# Patient Record
Sex: Male | Born: 1950 | Race: Black or African American | Hispanic: No | Marital: Single | Smoking: Current every day smoker
Health system: Southern US, Community
[De-identification: ages and names within clinical notes are randomized; demographics above are authoritative.]

## PROBLEM LIST (undated history)

## (undated) DIAGNOSIS — I2510 Atherosclerotic heart disease of native coronary artery without angina pectoris: Secondary | ICD-10-CM

## (undated) DIAGNOSIS — I639 Cerebral infarction, unspecified: Secondary | ICD-10-CM

## (undated) DIAGNOSIS — E119 Type 2 diabetes mellitus without complications: Secondary | ICD-10-CM

---

## 2012-05-10 ENCOUNTER — Emergency Department (HOSPITAL_COMMUNITY): Payer: Medicare Other

## 2012-05-10 ENCOUNTER — Encounter (HOSPITAL_COMMUNITY): Payer: Self-pay | Admitting: Emergency Medicine

## 2012-05-10 ENCOUNTER — Emergency Department (HOSPITAL_COMMUNITY)
Admission: EM | Admit: 2012-05-10 | Discharge: 2012-05-10 | Disposition: A | Discharge status: Home / Self Care | Payer: Medicare Other | Attending: Emergency Medicine | Admitting: Emergency Medicine

## 2012-05-10 DIAGNOSIS — M25551 Pain in right hip: Secondary | ICD-10-CM

## 2012-05-10 DIAGNOSIS — I2510 Atherosclerotic heart disease of native coronary artery without angina pectoris: Secondary | ICD-10-CM | POA: Insufficient documentation

## 2012-05-10 DIAGNOSIS — Z79899 Other long term (current) drug therapy: Secondary | ICD-10-CM | POA: Insufficient documentation

## 2012-05-10 DIAGNOSIS — E119 Type 2 diabetes mellitus without complications: Secondary | ICD-10-CM | POA: Insufficient documentation

## 2012-05-10 DIAGNOSIS — F17200 Nicotine dependence, unspecified, uncomplicated: Secondary | ICD-10-CM | POA: Insufficient documentation

## 2012-05-10 DIAGNOSIS — Z7902 Long term (current) use of antithrombotics/antiplatelets: Secondary | ICD-10-CM | POA: Insufficient documentation

## 2012-05-10 DIAGNOSIS — Z7982 Long term (current) use of aspirin: Secondary | ICD-10-CM | POA: Insufficient documentation

## 2012-05-10 DIAGNOSIS — Z8673 Personal history of transient ischemic attack (TIA), and cerebral infarction without residual deficits: Secondary | ICD-10-CM | POA: Insufficient documentation

## 2012-05-10 DIAGNOSIS — M25559 Pain in unspecified hip: Secondary | ICD-10-CM | POA: Insufficient documentation

## 2012-05-10 HISTORY — DX: Cerebral infarction, unspecified: I63.9

## 2012-05-10 HISTORY — DX: Atherosclerotic heart disease of native coronary artery without angina pectoris: I25.10

## 2012-05-10 HISTORY — DX: Type 2 diabetes mellitus without complications: E11.9

## 2012-05-10 LAB — POCT I-STAT, CHEM 8
Calcium, Ion: 1.18 mmol/L (ref 1.13–1.30)
Creatinine, Ser: 1.2 mg/dL (ref 0.50–1.35)
Glucose, Bld: 110 mg/dL — ABNORMAL HIGH (ref 70–99)
HCT: 39 % (ref 39.0–52.0)
Hemoglobin: 13.3 g/dL (ref 13.0–17.0)
Potassium: 3.4 mEq/L — ABNORMAL LOW (ref 3.5–5.1)
TCO2: 25 mmol/L (ref 0–100)

## 2012-05-10 LAB — URINALYSIS, ROUTINE W REFLEX MICROSCOPIC
Bilirubin Urine: NEGATIVE
Glucose, UA: NEGATIVE mg/dL
Hgb urine dipstick: NEGATIVE
Ketones, ur: NEGATIVE mg/dL
Leukocytes, UA: NEGATIVE
Nitrite: NEGATIVE
Protein, ur: NEGATIVE mg/dL
Specific Gravity, Urine: 1.012 (ref 1.005–1.030)
Urobilinogen, UA: 1 mg/dL (ref 0.0–1.0)
pH: 6.5 (ref 5.0–8.0)

## 2012-05-10 MED ORDER — HYDROCODONE-ACETAMINOPHEN 5-325 MG PO TABS: 1.0000 | ORAL_TABLET | Freq: Four times a day (QID) | ORAL | Status: DC | PRN

## 2012-05-10 MED ORDER — HYDROCODONE-ACETAMINOPHEN 5-325 MG PO TABS
1.0000 | ORAL_TABLET | Freq: Once | ORAL | Status: AC
  Administered 2012-05-10: 1 via ORAL
  Filled 2012-05-10: qty 1

## 2012-05-10 NOTE — ED Provider Notes (Signed)
History     CSN: [id number]  Arrival date & time 05/10/12  4098   First MD Initiated Contact with Patient 05/10/12 [phone]      Chief Complaint  Patient presents with  . Hip Pain    (Consider location/radiation/quality/duration/timing/severity/associated sxs/prior treatment) HPI Patient presents to the emergency department with bilateral hip pain, worse on the right.  Patient, states, that he was walking to the store when he started having bilateral hip pain.  Patient denies abdominal pain, nausea, vomiting, shortness of breath, chest pain, back pain, dysuria, fever, dizziness, or syncope.  Patient, states, that he not have any history of back or hip pain.  He says, states, that did not fall or have any injury to the hips. Past Medical History  Diagnosis Date  . Diabetes mellitus without complication   . Coronary artery disease   . Stroke     History reviewed. No pertinent past surgical history.  History reviewed. No pertinent family history.  History  Substance Use Topics  . Smoking status: Current Every Day Smoker -- 0.50 packs/day for 25 years    Types: Cigarettes  . Smokeless tobacco: Not on file  . Alcohol Use: No      Review of Systems All other systems negative except as documented in the HPI. All pertinent positives and negatives as reviewed in the HPI.  Allergies  Review of patient's allergies indicates no known allergies.  Home Medications   Current Outpatient Rx  Name  Route  Sig  Dispense  Refill  . amLODipine (NORVASC) 10 MG tablet   Oral   Take 10 mg by mouth daily.         Marland Kitchen aspirin EC 325 MG tablet   Oral   Take 325 mg by mouth daily.         Marland Kitchen atorvastatin (LIPITOR) 40 MG tablet   Oral   Take 40 mg by mouth daily.         . clopidogrel (PLAVIX) 75 MG tablet   Oral   Take 75 mg by mouth daily.         Marland Kitchen FLUoxetine (PROZAC) 10 MG capsule   Oral   Take 10 mg by mouth daily.         . isosorbide mononitrate (IMDUR) 60 MG 24 hr  tablet   Oral   Take 60 mg by mouth daily.         Marland Kitchen lisinopril (PRINIVIL,ZESTRIL) 40 MG tablet   Oral   Take 40 mg by mouth daily.         . naproxen (NAPROSYN) 250 MG tablet   Oral   Take 250 mg by mouth daily.         . nitroGLYCERIN (NITROSTAT) 0.4 MG SL tablet   Sublingual   Place 0.4 mg under the tongue every 5 (five) minutes as needed for chest pain.         Marland Kitchen HYDROcodone-acetaminophen (NORCO/VICODIN) 5-325 MG per tablet   Oral   Take 1 tablet by mouth every 6 (six) hours as needed for pain.   15 tablet   0     BP 148/64  Pulse 66  Temp(Src) 98.2 F (36.8 C) (Oral)  Resp 16  SpO2 100%  Physical Exam  Constitutional: He is oriented to person, place, and time. He appears well-developed and well-nourished. No distress.  HENT:  Head: Normocephalic and atraumatic.  Mouth/Throat: Oropharynx is clear and moist.  Eyes: Pupils are equal, round, and reactive to light.  Neck: Normal range of motion. Neck supple.  Cardiovascular: Normal rate, regular rhythm and normal heart sounds.  Exam reveals no gallop and no friction rub.   No murmur heard. Pulmonary/Chest: Effort normal and breath sounds normal. No respiratory distress. He has no rales.  Abdominal: Soft. Bowel sounds are normal. He exhibits no distension. There is no tenderness.  Musculoskeletal:       Right hip: He exhibits tenderness. He exhibits normal range of motion, normal strength, no bony tenderness, no swelling, no crepitus and no deformity.       Left hip: He exhibits tenderness. He exhibits normal range of motion, normal strength, no bony tenderness, no swelling, no crepitus and no deformity.  Neurological: He is alert and oriented to person, place, and time. He exhibits normal muscle tone. Coordination normal.  Skin: Skin is warm and dry. No rash noted.    ED Course  Procedures (including critical care time)  Labs Reviewed  POCT I-STAT, CHEM 8 - Abnormal; Notable for the following:     Potassium 3.4 (*)    Glucose, Bld 110 (*)    All other components within normal limits  URINALYSIS, ROUTINE W REFLEX MICROSCOPIC   Dg Lumbar Spine Complete  05/10/2012  *RADIOLOGY REPORT*  Clinical Data:  lower back pain, fall  LUMBAR SPINE - COMPLETE 4+ VIEW  Comparison: Fall  Findings: Five views of the lumbar spine submitted.  No acute fracture or subluxation.  Multilevel mild anterior spurring.  The alignment and vertebral height are preserved.  Atherosclerotic calcifications of the abdominal aorta.  IMPRESSION: . No acute fracture or subluxation.  Multilevel mild degenerative changes.   Original Report Authenticated By: Natasha Mead, M.D.    Dg Pelvis 1-2 Views  05/10/2012  *RADIOLOGY REPORT*  Clinical Data: Fall, back pain  PELVIS - 1-2 VIEW  Comparison: None.  Findings: Two views of the pelvis submitted.  No acute fracture or subluxation.  Degenerative changes noted bilateral hip joints with narrowing superior joint space. Bilateral superior acetabular spurring.  IMPRESSION: No acute fracture or subluxation.  Degenerative changes bilateral hip joints.   Original Report Authenticated By: Natasha Mead, M.D.      1. Hip pain, right    Patient treated for his hip pain, did some basic labs tests on the patient with no abnormalities.  Advised patient, that he'll need to follow up with his primary care Dr. for further evaluation and recheck.  He is advised to return here for any worsening in his condition.  Patient has normal distal pulses and sensation in his leg.  Patient does not have any motor deficits and has normal reflexes in all 4 extremities.   MDM  MDM Reviewed: vitals and nursing note Interpretation: labs and x-ray            Carlyle Dolly, PA-C 05/11/12 1521

## 2012-05-10 NOTE — ED Notes (Signed)
Attempted to collect urine, pt could not void 

## 2012-05-10 NOTE — Discharge Instructions (Signed)
Your tests here today were normal. return here as needed.followup with your primary care Dr. For recheck

## 2012-05-10 NOTE — ED Notes (Signed)
Pt arrives to ed via [facility] c/o bilat hip pain onset 1 hr pta.  Pt denies fall/previous surgeries on hips.  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  Caox4, pmsx4, nad.

## 2012-05-11 NOTE — ED Provider Notes (Signed)
Medical screening examination/treatment/procedure(s) were performed by non-physician practitioner and as supervising physician I was immediately available for consultation/collaboration.   Loren Racer, MD 05/11/12 1537

## 2014-12-07 ENCOUNTER — Encounter (HOSPITAL_COMMUNITY): Payer: Self-pay | Admitting: Emergency Medicine

## 2014-12-07 ENCOUNTER — Emergency Department (HOSPITAL_COMMUNITY): Payer: Medicare Other

## 2014-12-07 ENCOUNTER — Emergency Department (HOSPITAL_COMMUNITY)
Admission: EM | Admit: 2014-12-07 | Discharge: 2014-12-07 | Disposition: A | Discharge status: Home / Self Care | Payer: Medicare Other | Attending: Emergency Medicine | Admitting: Emergency Medicine

## 2014-12-07 DIAGNOSIS — I2510 Atherosclerotic heart disease of native coronary artery without angina pectoris: Secondary | ICD-10-CM | POA: Insufficient documentation

## 2014-12-07 DIAGNOSIS — W231XXA Caught, crushed, jammed, or pinched between stationary objects, initial encounter: Secondary | ICD-10-CM | POA: Diagnosis not present

## 2014-12-07 DIAGNOSIS — S99921A Unspecified injury of right foot, initial encounter: Secondary | ICD-10-CM | POA: Diagnosis present

## 2014-12-07 DIAGNOSIS — Z79899 Other long term (current) drug therapy: Secondary | ICD-10-CM | POA: Diagnosis not present

## 2014-12-07 DIAGNOSIS — E119 Type 2 diabetes mellitus without complications: Secondary | ICD-10-CM | POA: Insufficient documentation

## 2014-12-07 DIAGNOSIS — Z7982 Long term (current) use of aspirin: Secondary | ICD-10-CM | POA: Diagnosis not present

## 2014-12-07 DIAGNOSIS — M79674 Pain in right toe(s): Secondary | ICD-10-CM

## 2014-12-07 DIAGNOSIS — F17210 Nicotine dependence, cigarettes, uncomplicated: Secondary | ICD-10-CM | POA: Insufficient documentation

## 2014-12-07 DIAGNOSIS — Y9389 Activity, other specified: Secondary | ICD-10-CM | POA: Diagnosis not present

## 2014-12-07 DIAGNOSIS — Z7902 Long term (current) use of antithrombotics/antiplatelets: Secondary | ICD-10-CM | POA: Insufficient documentation

## 2014-12-07 DIAGNOSIS — Z8673 Personal history of transient ischemic attack (TIA), and cerebral infarction without residual deficits: Secondary | ICD-10-CM | POA: Diagnosis not present

## 2014-12-07 DIAGNOSIS — Y9289 Other specified places as the place of occurrence of the external cause: Secondary | ICD-10-CM | POA: Insufficient documentation

## 2014-12-07 DIAGNOSIS — S90121A Contusion of right lesser toe(s) without damage to nail, initial encounter: Secondary | ICD-10-CM | POA: Insufficient documentation

## 2014-12-07 DIAGNOSIS — Y998 Other external cause status: Secondary | ICD-10-CM | POA: Diagnosis not present

## 2014-12-07 LAB — CBG MONITORING, ED: Glucose-Capillary: 134 mg/dL — ABNORMAL HIGH (ref 65–99)

## 2014-12-07 MED ORDER — NAPROXEN 250 MG PO TABS: 250.0000 mg | ORAL_TABLET | Freq: Two times a day (BID) | ORAL | Status: DC

## 2014-12-07 NOTE — ED Notes (Signed)
Patient brought back to room via wheelchair; patient getting undressed and into a gown at this time; Tammy SoursGreg, RN aware

## 2014-12-07 NOTE — ED Notes (Signed)
Pt states that he stumped his toe 1 month ago and pain has gotten increasely worse and also has a blacken area on his third toe. Pt has hx of diabetes. Sensation in intact, pedal pulses +2 equal bilaterally.

## 2014-12-07 NOTE — ED Provider Notes (Signed)
CSN: [id number]     Arrival date & time 12/07/14  1107 History   First MD Initiated Contact with Patient 12/07/14 1517     Chief Complaint  Patient presents with  . Toe Pain    Patient is a 64 y.o. male presenting with extremity pain. The history is provided by the patient.  Extremity Pain This is a new problem. The current episode started 1 to 4 weeks ago. The problem has been gradually worsening. Pertinent negatives include no abdominal pain, chest pain, chills, coughing, fever, headaches, nausea, neck pain, rash, sore throat or vomiting. He has tried acetaminophen for the symptoms.    Past Medical History  Diagnosis Date  . Diabetes mellitus without complication (HCC)   . Coronary artery disease   . Stroke [facility](HCC)    History reviewed. No pertinent past surgical history. No family history on file. Social History  Substance Use Topics  . Smoking status: Current Every Day Smoker -- 0.50 packs/day for 25 years    Types: Cigarettes  . Smokeless tobacco: None  . Alcohol Use: No    Review of Systems  Constitutional: Negative for fever and chills.  HENT: Negative for rhinorrhea and sore throat.   Eyes: Negative for visual disturbance.  Respiratory: Negative for cough and shortness of breath.   Cardiovascular: Negative for chest pain.  Gastrointestinal: Negative for nausea, vomiting, abdominal pain, diarrhea and constipation.  Genitourinary: Negative for dysuria and hematuria.  Musculoskeletal: Negative for back pain and neck pain.       Toe pain  Skin: Negative for rash.  Neurological: Negative for syncope and headaches.  Psychiatric/Behavioral: Negative for confusion.  All other systems reviewed and are negative.  Allergies  Review of patient's allergies indicates no known allergies.  Home Medications   Prior to Admission medications   Medication Sig Start Date End Date Taking? Authorizing Provider  amLODipine (NORVASC) 10 MG tablet Take 10 mg by mouth daily.     Historical Provider, MD  aspirin EC 325 MG tablet Take 325 mg by mouth daily.    Historical Provider, MD  atorvastatin (LIPITOR) 40 MG tablet Take 40 mg by mouth daily.    Historical Provider, MD  clopidogrel (PLAVIX) 75 MG tablet Take 75 mg by mouth daily.    Historical Provider, MD  FLUoxetine (PROZAC) 10 MG capsule Take 10 mg by mouth daily.    Historical Provider, MD  HYDROcodone-acetaminophen (NORCO/VICODIN) 5-325 MG per tablet Take 1 tablet by mouth every 6 (six) hours as needed for pain. 05/10/12   Charlestine Nighthristopher Lawyer, PA-C  isosorbide mononitrate (IMDUR) 60 MG 24 hr tablet Take 60 mg by mouth daily.    Historical Provider, MD  lisinopril (PRINIVIL,ZESTRIL) 40 MG tablet Take 40 mg by mouth daily.    Historical Provider, MD  naproxen (NAPROSYN) 250 MG tablet Take 1 tablet (250 mg total) by mouth 2 (two) times daily with a meal. 12/07/14   Maris BergerJonah Keante Urizar, MD  nitroGLYCERIN (NITROSTAT) 0.4 MG SL tablet Place 0.4 mg under the tongue every 5 (five) minutes as needed for chest pain.    Historical Provider, MD   BP 123/59 mmHg  Pulse 79  Temp(Src) 98.7 F (37.1 C) (Oral)  Resp 16  Ht 5\' 6"  (1.676 m)  Wt 81.647 kg  BMI 29.07 kg/m2  SpO2 100% Physical Exam  Constitutional: He is oriented to person, place, and time. He appears well-developed and well-nourished. No distress.  HENT:  Head: Normocephalic and atraumatic.  Mouth/Throat: Oropharynx is clear and moist.  Eyes: EOM are normal.  Neck: Neck supple. No JVD present.  Cardiovascular: Normal rate, regular rhythm, normal heart sounds and intact distal pulses.   Pulmonary/Chest: Effort normal and breath sounds normal.  Abdominal: Soft. He exhibits no distension. There is no tenderness.  Musculoskeletal: Normal range of motion. He exhibits no edema.  Ecchymosis right 3rd toe, tender to palpation or manipulation. Symmetric distal pulses.   Neurological: He is alert and oriented to person, place, and time. No cranial nerve deficit.  Skin: Skin  is warm and dry.  Psychiatric: His behavior is normal.    ED Course  Procedures  None   Labs Review Labs Reviewed  CBG MONITORING, ED - Abnormal; Notable for the following:    Glucose-Capillary 134 (*)    All other components within normal limits    Imaging Review Dg Foot Complete Right  12/07/2014  CLINICAL DATA:  Third toe pain for 1 month after stubbing injury. Discolored foot. Initial encounter. EXAM: RIGHT FOOT COMPLETE - 3+ VIEW COMPARISON:  None. FINDINGS: When allowing for digital obliquity, there is no convincing toe fracture and no dislocation. No opaque foreign body or other acute soft tissue finding. 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 Chondroid tumor is the main differential consideration. The appearance is overall benign. IMPRESSION: 1. No acute osseous finding. 2. 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 Electronically Signed   By: Marnee Spring M.D.   On: 12/07/2014 13:29   I have personally reviewed and evaluated these images and lab results as part of my medical decision-making.  MDM   Final diagnoses:  Pain of toe of right foot   64 yo diabetic male presents with right toe pain after stumping his foot over 1 month ago. Denies fever. Does have neuropathy. XR neg for acute fracture or periosteal changes. 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 Findings discussed with patient. Recommended judicious NSAID use for pain and podiatry follow up. I provided verbal discharge instructions including follow up and return precautions. Stable for discharge.   Case discussed with Dr. Denton Lank.   Maris Berger, MD 12/07/14 7829  Cathren Laine, MD 12/08/14 [phone]

## 2014-12-15 ENCOUNTER — Ambulatory Visit: Payer: Self-pay | Admitting: Family Medicine

## 2014-12-15 ENCOUNTER — Ambulatory Visit: Payer: Self-pay | Admitting: Podiatry

## 2014-12-22 ENCOUNTER — Ambulatory Visit: Payer: Self-pay | Admitting: Family Medicine

## 2014-12-23 ENCOUNTER — Encounter (HOSPITAL_COMMUNITY): Payer: Self-pay | Admitting: Emergency Medicine

## 2014-12-23 ENCOUNTER — Emergency Department (HOSPITAL_COMMUNITY)
Admission: EM | Admit: 2014-12-23 | Discharge: 2014-12-23 | Disposition: A | Discharge status: Home / Self Care | Payer: Medicare Other | Attending: Emergency Medicine | Admitting: Emergency Medicine

## 2014-12-23 DIAGNOSIS — I2510 Atherosclerotic heart disease of native coronary artery without angina pectoris: Secondary | ICD-10-CM | POA: Insufficient documentation

## 2014-12-23 DIAGNOSIS — M545 Low back pain: Secondary | ICD-10-CM | POA: Diagnosis present

## 2014-12-23 DIAGNOSIS — Z8673 Personal history of transient ischemic attack (TIA), and cerebral infarction without residual deficits: Secondary | ICD-10-CM | POA: Diagnosis not present

## 2014-12-23 DIAGNOSIS — E119 Type 2 diabetes mellitus without complications: Secondary | ICD-10-CM | POA: Diagnosis not present

## 2014-12-23 DIAGNOSIS — M5441 Lumbago with sciatica, right side: Secondary | ICD-10-CM | POA: Insufficient documentation

## 2014-12-23 DIAGNOSIS — Z7982 Long term (current) use of aspirin: Secondary | ICD-10-CM | POA: Diagnosis not present

## 2014-12-23 DIAGNOSIS — Z7902 Long term (current) use of antithrombotics/antiplatelets: Secondary | ICD-10-CM | POA: Insufficient documentation

## 2014-12-23 DIAGNOSIS — F17210 Nicotine dependence, cigarettes, uncomplicated: Secondary | ICD-10-CM | POA: Insufficient documentation

## 2014-12-23 DIAGNOSIS — G8929 Other chronic pain: Secondary | ICD-10-CM | POA: Insufficient documentation

## 2014-12-23 DIAGNOSIS — Z791 Long term (current) use of non-steroidal anti-inflammatories (NSAID): Secondary | ICD-10-CM | POA: Insufficient documentation

## 2014-12-23 DIAGNOSIS — Z79899 Other long term (current) drug therapy: Secondary | ICD-10-CM | POA: Insufficient documentation

## 2014-12-23 LAB — CBG MONITORING, ED: GLUCOSE-CAPILLARY: 134 mg/dL — AB (ref 65–99)

## 2014-12-23 MED ORDER — ONDANSETRON 4 MG PO TBDP
4.0000 mg | ORAL_TABLET | Freq: Once | ORAL | Status: AC
  Administered 2014-12-23: 4 mg via ORAL
  Filled 2014-12-23: qty 1

## 2014-12-23 MED ORDER — PROCHLORPERAZINE EDISYLATE 5 MG/ML IJ SOLN
5.0000 mg | Freq: Once | INTRAMUSCULAR | Status: AC
  Administered 2014-12-23: 5 mg via INTRAMUSCULAR
  Filled 2014-12-23: qty 2

## 2014-12-23 MED ORDER — DIPHENHYDRAMINE HCL 50 MG/ML IJ SOLN
25.0000 mg | Freq: Once | INTRAMUSCULAR | Status: AC
  Administered 2014-12-23: 25 mg via INTRAMUSCULAR
  Filled 2014-12-23: qty 1

## 2014-12-23 MED ORDER — OXYCODONE-ACETAMINOPHEN 5-325 MG PO TABS: 1.0000 | ORAL_TABLET | ORAL | Status: AC | PRN

## 2014-12-23 MED ORDER — HYDROMORPHONE HCL 1 MG/ML IJ SOLN
2.0000 mg | Freq: Once | INTRAMUSCULAR | Status: AC
  Administered 2014-12-23: 2 mg via INTRAMUSCULAR
  Filled 2014-12-23: qty 2

## 2014-12-23 NOTE — ED Notes (Signed)
Pt reports lower back pain since yesterday, has chronic back pain. Called EMS to bring him. Pt reports takes percocet for pain and is out of meds.

## 2014-12-23 NOTE — Discharge Instructions (Signed)
SEEK IMMEDIATE MEDICAL ATTENTION IF: New numbness, tingling, weakness, or problem with the use of your arms or legs.  Severe back pain not relieved with medications.  Change in bowel or bladder control.  Increasing pain in any areas of the body (such as chest or abdominal pain).  Shortness of breath, dizziness or fainting.  Nausea (feeling sick to your stomach), vomiting, fever, or sweats.  Chronic Back Pain  When back pain lasts longer than 3 months, it is called chronic back pain.People with chronic back pain often go through certain periods that are more intense (flare-ups).  CAUSES Chronic back pain can be caused by wear and tear (degeneration) on different structures in your back. These structures include:  The bones of your spine (vertebrae) and the joints surrounding your spinal cord and nerve roots (facets).  The strong, fibrous tissues that connect your vertebrae (ligaments). Degeneration of these structures may result in pressure on your nerves. This can lead to constant pain. HOME CARE INSTRUCTIONS  Avoid bending, heavy lifting, prolonged sitting, and activities which make the problem worse.  Take brief periods of rest throughout the day to reduce your pain. Lying down or standing usually is better than sitting while you are resting.  Take over-the-counter or prescription medicines only as directed by your caregiver. SEEK IMMEDIATE MEDICAL CARE IF:   You have weakness or numbness in one of your legs or feet.  You have trouble controlling your bladder or bowels.  You have nausea, vomiting, abdominal pain, shortness of breath, or fainting.   This information is not intended to replace advice given to you by your health care provider. Make sure you discuss any questions you have with your health care provider.   Document Released: 02/02/2004 Document Revised: 03/19/2011 Document Reviewed: 06/14/2014 Elsevier Interactive Patient Education 2016 [employer]  Back  Exercises The following exercises strengthen the muscles that help to support the back. They also help to keep the lower back flexible. Doing these exercises can help to prevent back pain or lessen existing pain. If you have back pain or discomfort, try doing these exercises 2-3 times each day or as told by your health care provider. When the pain goes away, do them once each day, but increase the number of times that you repeat the steps for each exercise (do more repetitions). If you do not have back pain or discomfort, do these exercises once each day or as told by your health care provider. EXERCISES Single Knee to Chest Repeat these steps 3-5 times for each leg:  Lie on your back on a firm bed or the floor with your legs extended.  Bring one knee to your chest. Your other leg should stay extended and in contact with the floor.  Hold your knee in place by grabbing your knee or thigh.  Pull on your knee until you feel a gentle stretch in your lower back.  Hold the stretch for 10-30 seconds.  Slowly release and straighten your leg. Pelvic Tilt Repeat these steps 5-10 times:  Lie on your back on a firm bed or the floor with your legs extended.  Bend your knees so they are pointing toward the ceiling and your feet are flat on the floor.  Tighten your lower abdominal muscles to press your lower back against the floor. This motion will tilt your pelvis so your tailbone points up toward the ceiling instead of pointing to your feet or the floor.  With gentle tension and even breathing, hold this position  for 5-10 seconds. Cat-Cow Repeat these steps until your lower back becomes more flexible:  Get into a hands-and-knees position on a firm surface. Keep your hands under your shoulders, and keep your knees under your hips. You may place padding under your knees for comfort.  Let your head hang down, and point your tailbone toward the floor so your lower back becomes rounded like the back  of a cat.  Hold this position for 5 seconds.  Slowly lift your head and point your tailbone up toward the ceiling so your back forms a sagging arch like the back of a cow.  Hold this position for 5 seconds. Press-Ups Repeat these steps 5-10 times:  Lie on your abdomen (face-down) on the floor.  Place your palms near your head, about shoulder-width apart.  While you keep your back as relaxed as possible and keep your hips on the floor, slowly straighten your arms to raise the top half of your body and lift your shoulders. Do not use your back muscles to raise your upper torso. You may adjust the placement of your hands to make yourself more comfortable.  Hold this position for 5 seconds while you keep your back relaxed.  Slowly return to lying flat on the floor. Bridges Repeat these steps 10 times:  Lie on your back on a firm surface.  Bend your knees so they are pointing toward the ceiling and your feet are flat on the floor.  Tighten your buttocks muscles and lift your buttocks off of the floor until your waist is at almost the same height as your knees. You should feel the muscles working in your buttocks and the back of your thighs. If you do not feel these muscles, slide your feet 1-2 inches farther away from your buttocks.  Hold this position for 3-5 seconds.  Slowly lower your hips to the starting position, and allow your buttocks muscles to relax completely. If this exercise is too easy, try doing it with your arms crossed over your chest. Abdominal Crunches Repeat these steps 5-10 times:  Lie on your back on a firm bed or the floor with your legs extended.  Bend your knees so they are pointing toward the ceiling and your feet are flat on the floor.  Cross your arms over your chest.  Tip your chin slightly toward your chest without bending your neck.  Tighten your abdominal muscles and slowly raise your trunk (torso) high enough to lift your shoulder blades a tiny  bit off of the floor. Avoid raising your torso higher than that, because it can put too much stress on your low back and it does not help to strengthen your abdominal muscles.  Slowly return to your starting position. Back Lifts Repeat these steps 5-10 times:  Lie on your abdomen (face-down) with your arms at your sides, and rest your forehead on the floor.  Tighten the muscles in your legs and your buttocks.  Slowly lift your chest off of the floor while you keep your hips pressed to the floor. Keep the back of your head in line with the curve in your back. Your eyes should be looking at the floor.  Hold this position for 3-5 seconds.  Slowly return to your starting position. SEEK MEDICAL CARE IF:  Your back pain or discomfort gets much worse when you do an exercise.  Your back pain or discomfort does not lessen within 2 hours after you exercise. If you have any of these problems, stop  doing these exercises right away. Do not do them again unless your health care provider says that you can. SEEK IMMEDIATE MEDICAL CARE IF:  You develop sudden, severe back pain. If this happens, stop doing the exercises right away. Do not do them again unless your health care provider says that you can.   This information is not intended to replace advice given to you by your health care provider. Make sure you discuss any questions you have with your health care provider.   Document Released: 02/02/2004 Document Revised: 09/15/2014 Document Reviewed: 02/18/2014 Elsevier Interactive Patient Education 2016 [employer]  Sciatica Sciatica is pain, weakness, numbness, or tingling along the path of the sciatic nerve. The nerve starts in the lower back and runs down the back of each leg. The nerve controls the muscles in the lower leg and in the back of the knee, while also providing sensation to the back of the thigh, lower leg, and the sole of your foot. Sciatica is a symptom of another medical  condition. For instance, nerve damage or certain conditions, such as a herniated disk or bone spur on the spine, pinch or put pressure on the sciatic nerve. This causes the pain, weakness, or other sensations normally associated with sciatica. Generally, sciatica only affects one side of the body. CAUSES   Herniated or slipped disc.  Degenerative disk disease.  A pain disorder involving the narrow muscle in the buttocks (piriformis syndrome).  Pelvic injury or fracture.  Pregnancy.  Tumor (rare). SYMPTOMS  Symptoms can vary from mild to very severe. The symptoms usually travel from the low back to the buttocks and down the back of the leg. Symptoms can include:  Mild tingling or dull aches in the lower back, leg, or hip.  Numbness in the back of the calf or sole of the foot.  Burning sensations in the lower back, leg, or hip.  Sharp pains in the lower back, leg, or hip.  Leg weakness.  Severe back pain inhibiting movement. These symptoms may get worse with coughing, sneezing, laughing, or prolonged sitting or standing. Also, being overweight may worsen symptoms. DIAGNOSIS  Your caregiver will perform a physical exam to look for common symptoms of sciatica. He or she may ask you to do certain movements or activities that would trigger sciatic nerve pain. Other tests may be performed to find the cause of the sciatica. These may include:  Blood tests.  X-rays.  Imaging tests, such as an MRI or CT scan. TREATMENT  Treatment is directed at the cause of the sciatic pain. Sometimes, treatment is not necessary and the pain and discomfort goes away on its own. If treatment is needed, your caregiver may suggest:  Over-the-counter medicines to relieve pain.  Prescription medicines, such as anti-inflammatory medicine, muscle relaxants, or narcotics.  Applying heat or ice to the painful area.  Steroid injections to lessen pain, irritation, and inflammation around the  nerve.  Reducing activity during periods of pain.  Exercising and stretching to strengthen your abdomen and improve flexibility of your spine. Your caregiver may suggest losing weight if the extra weight makes the back pain worse.  Physical therapy.  Surgery to eliminate what is pressing or pinching the nerve, such as a bone spur or part of a herniated disk. HOME CARE INSTRUCTIONS   Only take over-the-counter or prescription medicines for pain or discomfort as directed by your caregiver.  Apply ice to the affected area for 20 minutes, 3-4 times a day for the first  48-72 hours. Then try heat in the same way.  Exercise, stretch, or perform your usual activities if these do not aggravate your pain.  Attend physical therapy sessions as directed by your caregiver.  Keep all follow-up appointments as directed by your caregiver.  Do not wear high heels or shoes that do not provide proper support.  Check your mattress to see if it is too soft. A firm mattress may lessen your pain and discomfort. SEEK IMMEDIATE MEDICAL CARE IF:   You lose control of your bowel or bladder (incontinence).  You have increasing weakness in the lower back, pelvis, buttocks, or legs.  You have redness or swelling of your back.  You have a burning sensation when you urinate.  You have pain that gets worse when you lie down or awakens you at night.  Your pain is worse than you have experienced in the past.  Your pain is lasting longer than 4 weeks.  You are suddenly losing weight without reason. MAKE SURE YOU:  Understand these instructions.  Will watch your condition.  Will get help right away if you are not doing well or get worse.   This information is not intended to replace advice given to you by your health care provider. Make sure you discuss any questions you have with your health care provider.   Document Released: 12/19/2000 Document Revised: 09/15/2014 Document Reviewed:  05/06/2011 Elsevier Interactive Patient Education [employer]

## 2014-12-23 NOTE — ED Notes (Signed)
PT reports he feels like he going to vomit. PRN given.

## 2014-12-23 NOTE — ED Notes (Signed)
EMT at bedside attempting to encourage patient with PO challenge.  No more episodes of vomiting noted at this time

## 2014-12-23 NOTE — ED Provider Notes (Signed)
CSN: [id number]     Arrival date & time 12/23/14  1254 History  By signing my name below, I, Tanda Rockers, attest that this documentation has been prepared under the direction and in the presence of Arthor Captain, PA-C.  Electronically Signed: Tanda Rockers, ED Scribe. 12/23/2014. 1:33 PM.   Chief Complaint  Patient presents with  . Back Pain   The history is provided by the patient. No language interpreter was used.     HPI Comments: Ryan Powell is a 64 y.o. male with hx chronic back pain who presents to the Emergency Department complaining of sudden onset, constant, 10/10, right lower back pain radiating down his entire right leg x 2 days. Pt states he was sleeping and rolled over in bed when he began having pain in his back. The pain is exacerbated with movement and prolonged standing. Pt has hx of chronic back pain and states it flares up every once and awhile. He usually takes Percocet for his pain but is currently out of his prescription. Denies dysuria, urgency, frequency, hematuria, urinary or bowel incontinence, weakness, numbness, tingling, or any other associated symptoms.    Past Medical History  Diagnosis Date  . Diabetes mellitus without complication (HCC)   . Coronary artery disease   . Stroke [facility])    History reviewed. No pertinent past surgical history. No family history on file. Social History  Substance Use Topics  . Smoking status: Current Every Day Smoker -- 0.50 packs/day for 25 years    Types: Cigarettes  . Smokeless tobacco: None  . Alcohol Use: No    Review of Systems  Gastrointestinal:       Negative for urinary incontinence  Genitourinary: Negative for dysuria, urgency, frequency and hematuria.       Negative for bowel incontinence  Musculoskeletal: Positive for back pain and arthralgias (Right leg).  Neurological: Negative for weakness and numbness.  All other systems reviewed and are negative.     Allergies  Review of patient's allergies  indicates no known allergies.  Home Medications   Prior to Admission medications   Medication Sig Start Date End Date Taking? Authorizing Provider  amLODipine (NORVASC) 10 MG tablet Take 10 mg by mouth daily.    Historical Provider, MD  aspirin EC 325 MG tablet Take 325 mg by mouth daily.    Historical Provider, MD  atorvastatin (LIPITOR) 40 MG tablet Take 40 mg by mouth daily.    Historical Provider, MD  clopidogrel (PLAVIX) 75 MG tablet Take 75 mg by mouth daily.    Historical Provider, MD  FLUoxetine (PROZAC) 10 MG capsule Take 10 mg by mouth daily.    Historical Provider, MD  HYDROcodone-acetaminophen (NORCO/VICODIN) 5-325 MG per tablet Take 1 tablet by mouth every 6 (six) hours as needed for pain. 05/10/12   Charlestine Night, PA-C  isosorbide mononitrate (IMDUR) 60 MG 24 hr tablet Take 60 mg by mouth daily.    Historical Provider, MD  lisinopril (PRINIVIL,ZESTRIL) 40 MG tablet Take 40 mg by mouth daily.    Historical Provider, MD  naproxen (NAPROSYN) 250 MG tablet Take 1 tablet (250 mg total) by mouth 2 (two) times daily with a meal. 12/07/14   Maris Berger, MD  nitroGLYCERIN (NITROSTAT) 0.4 MG SL tablet Place 0.4 mg under the tongue every 5 (five) minutes as needed for chest pain.    Historical Provider, MD   Triage Vitals: BP 130/74 mmHg  Pulse 70  Temp(Src) 98.3 F (36.8 C) (Oral)  Resp 18  SpO2  100%   Physical Exam  Physical Exam  Constitutional: Pt appears well-developed and well-nourished. No distress.  HENT:  Head: Normocephalic and atraumatic.  Mouth/Throat: Oropharynx is clear and moist. No oropharyngeal exudate.  Eyes: Conjunctivae are normal.  Neck: Normal range of motion. Neck supple.  Full ROM without pain  Cardiovascular: Normal rate, regular rhythm and intact distal pulses.   Pulmonary/Chest: Effort normal and breath sounds normal. No respiratory distress. Pt has no wheezes.  Abdominal: Soft. Pt exhibits no distension. There is no tenderness.   Musculoskeletal:  Full range of motion of the T-spine and L-spine No tenderness to palpation of the spinous processes of the T-spine or L-spine Mild tenderness to palpation of the right paraspinal muscles of the L-spine  No midline spinal tenderness ROM is limited due to pain Lymphadenopathy:    Pt has no cervical adenopathy.  Neurological: Pt is alert. Pt has normal reflexes.  Reflex Scores:      Bicep reflexes are 2+ on the right side and 2+ on the left side.      Brachioradialis reflexes are 2+ on the right side and 2+ on the left side.      Patellar reflexes are 2+ on the right side and 2+ on the left side.      Achilles reflexes are 2+ on the right side and 2+ on the left side. Speech is clear and goal oriented, follows commands Normal 5/5 strength in upper and lower extremities bilaterally including dorsiflexion and plantar flexion, strong and equal grip strength Sensation normal to light and sharp touch Moves extremities without ataxia, coordination intact Normal gait Normal balance No Clonus   Skin: Skin is warm and dry. No rash noted. Pt is not diaphoretic. No erythema.  Psychiatric: Pt has a normal mood and affect. Behavior is normal.  Nursing note and vitals reviewed.   ED Course  Procedures (including critical care time)  DIAGNOSTIC STUDIES: Oxygen Saturation is 100% on RA, normal by my interpretation.    COORDINATION OF CARE: 1:26 PM-Discussed treatment plan which includes Dilaudid injection with pt at bedside and pt agreed to plan.   Labs Review Labs Reviewed - No data to display  Imaging Review No results found.   EKG Interpretation None      MDM   Final diagnoses:  Right-sided low back pain with right-sided sciatica   Patient with back pain.  No neurological deficits and normal neuro exam.  Patient is ambulatory.  No loss of bowel or bladder control.  No concern for cauda equina.  No fever, night sweats, weight loss, h/o cancer, IVDA, no recent  procedure to back. No urinary symptoms suggestive of UTI.  Supportive care and return precaution discussed.  4:27 PM Patient became nauseous after taking dilaudid. I have ordered IM compazine and benadryl. The patient is resting currently.  Plan to PO challenge and ambulate. I have given the patient in sign out to PA Tapia who will dispo the patient. I personally performed the services described in this documentation, which was scribed in my presence. The recorded information has been reviewed and is accurate.     I personally performed the services described in this documentation, which was scribed in my presence. The recorded information has been reviewed and is accurate.       Arthor Captainbigail Starlynn Klinkner, PA-C 12/23/14 1632  Rolland PorterMark James, MD 12/26/14 [phone]

## 2014-12-23 NOTE — ED Notes (Signed)
Pt ambulated in hall with RN and EMT.  Pt used cane, as he does at home.  Gait steady and even, but small steps taken.  Pt verbalized understanding of discharge instructions and states he feels safe going home.  Pt arrived via [facility] and due to past medical history and age, pt provided with cab voucher, approved by Irving BurtonEmily, AD.

## 2014-12-23 NOTE — ED Notes (Signed)
CBG 134 mg/dL reported to [employer] PA

## 2014-12-29 ENCOUNTER — Emergency Department (HOSPITAL_COMMUNITY): Payer: Medicare Other

## 2014-12-29 ENCOUNTER — Emergency Department (HOSPITAL_COMMUNITY)
Admission: EM | Admit: 2014-12-29 | Discharge: 2014-12-29 | Disposition: A | Discharge status: Home / Self Care | Payer: Medicare Other | Attending: Emergency Medicine | Admitting: Emergency Medicine

## 2014-12-29 ENCOUNTER — Encounter (HOSPITAL_COMMUNITY): Payer: Self-pay | Admitting: *Deleted

## 2014-12-29 DIAGNOSIS — Z794 Long term (current) use of insulin: Secondary | ICD-10-CM | POA: Diagnosis not present

## 2014-12-29 DIAGNOSIS — Z7901 Long term (current) use of anticoagulants: Secondary | ICD-10-CM | POA: Diagnosis not present

## 2014-12-29 DIAGNOSIS — Z7982 Long term (current) use of aspirin: Secondary | ICD-10-CM | POA: Insufficient documentation

## 2014-12-29 DIAGNOSIS — Z8673 Personal history of transient ischemic attack (TIA), and cerebral infarction without residual deficits: Secondary | ICD-10-CM | POA: Insufficient documentation

## 2014-12-29 DIAGNOSIS — E119 Type 2 diabetes mellitus without complications: Secondary | ICD-10-CM | POA: Diagnosis not present

## 2014-12-29 DIAGNOSIS — Z79899 Other long term (current) drug therapy: Secondary | ICD-10-CM | POA: Diagnosis not present

## 2014-12-29 DIAGNOSIS — I2510 Atherosclerotic heart disease of native coronary artery without angina pectoris: Secondary | ICD-10-CM | POA: Insufficient documentation

## 2014-12-29 DIAGNOSIS — M545 Low back pain, unspecified: Secondary | ICD-10-CM

## 2014-12-29 DIAGNOSIS — Z7902 Long term (current) use of antithrombotics/antiplatelets: Secondary | ICD-10-CM | POA: Diagnosis not present

## 2014-12-29 DIAGNOSIS — F17210 Nicotine dependence, cigarettes, uncomplicated: Secondary | ICD-10-CM | POA: Diagnosis not present

## 2014-12-29 MED ORDER — CARISOPRODOL 350 MG PO TABS: 350.0000 mg | ORAL_TABLET | Freq: Three times a day (TID) | ORAL | Status: AC

## 2014-12-29 MED ORDER — OXYCODONE HCL 5 MG PO TABS
5.0000 mg | ORAL_TABLET | Freq: Once | ORAL | Status: AC
  Administered 2014-12-29: 5 mg via ORAL
  Filled 2014-12-29: qty 1

## 2014-12-29 MED ORDER — TRAMADOL HCL 50 MG PO TABS: 50.0000 mg | ORAL_TABLET | Freq: Four times a day (QID) | ORAL | Status: AC | PRN

## 2014-12-29 NOTE — ED Provider Notes (Signed)
CSN: [id number]     Arrival date & time 12/29/14  1255 History   First MD Initiated Contact with Patient 12/29/14 1324     Chief Complaint  Patient presents with  . Back Pain    Patient is a 64 y.o. male presenting with back pain.  Back Pain  Patient presents to the emergency room for evaluation of lower back pain. Patient states she's had low back pain for a number years ever since an injury in the past. He recently moved to this area and does not have a doctor that he has been seen. Patient has come to the emergency room a couple times because of pain. He was most recently seen about 3 days ago for the same symptoms. Patient was given a dose of pain medications.  Patient states when he woke up this morning and was having pain in his lower back still. He had trouble getting out of bed so he called 911. Denies any abdominal pain. No difficulty urinating. No fever. No numbness or weakness. Is constant his lower back. Movement and palpation increases the pain.  Past Medical History  Diagnosis Date  . Diabetes mellitus without complication (HCC)   . Coronary artery disease   . Stroke [facility](HCC)    History reviewed. No pertinent past surgical history. No family history on file. Social History  Substance Use Topics  . Smoking status: Current Every Day Smoker -- 0.50 packs/day for 25 years    Types: Cigarettes  . Smokeless tobacco: None  . Alcohol Use: No    Review of Systems  Musculoskeletal: Positive for back pain.  All other systems reviewed and are negative.     Allergies  Review of patient's allergies indicates no known allergies.  Home Medications   Prior to Admission medications   Medication Sig Start Date End Date Taking? Authorizing Provider  amLODipine (NORVASC) 10 MG tablet Take 10 mg by mouth daily.   Yes Historical Provider, MD  aspirin EC 81 MG tablet Take 81 mg by mouth daily.   Yes Historical Provider, MD  atorvastatin (LIPITOR) 40 MG tablet Take 40 mg by mouth  daily.   Yes Historical Provider, MD  clopidogrel (PLAVIX) 75 MG tablet Take 75 mg by mouth daily.   Yes Historical Provider, MD  FLUoxetine (PROZAC) 10 MG capsule Take 10 mg by mouth daily.   Yes Historical Provider, MD  insulin glargine (LANTUS) 100 UNIT/ML injection Inject 10 Units into the skin at bedtime. 10/02/13  Yes Historical Provider, MD  isosorbide mononitrate (IMDUR) 60 MG 24 hr tablet Take 60 mg by mouth daily.   Yes Historical Provider, MD  lisinopril (PRINIVIL,ZESTRIL) 40 MG tablet Take 40 mg by mouth daily.   Yes Historical Provider, MD  metFORMIN (GLUCOPHAGE-XR) 500 MG 24 hr tablet Take 1,000 mg by mouth daily as needed. High blood sugar 08/11/14  Yes Historical Provider, MD  nitroGLYCERIN (NITROSTAT) 0.4 MG SL tablet Place 0.4 mg under the tongue every 5 (five) minutes as needed for chest pain.   Yes Historical Provider, MD  oxyCODONE-acetaminophen (PERCOCET) 5-325 MG tablet Take 1-2 tablets by mouth every 4 (four) hours as needed. Patient taking differently: Take 1-2 tablets by mouth every 4 (four) hours as needed for moderate pain.  12/23/14  Yes Arthor CaptainAbigail Harris, PA-C  ranitidine (ZANTAC) 300 MG tablet Take 300 tablets by mouth 2 (two) times daily. 10/14/14  Yes Historical Provider, MD  rivaroxaban (XARELTO) 20 MG TABS tablet Take 20 mg by mouth daily.   Yes  Historical Provider, MD  carisoprodol (SOMA) 350 MG tablet Take 1 tablet (350 mg total) by mouth 3 (three) times daily. 12/29/14   Linwood Dibbles, MD  traMADol (ULTRAM) 50 MG tablet Take 1 tablet (50 mg total) by mouth every 6 (six) hours as needed. 12/29/14   Linwood Dibbles, MD   BP 154/95 mmHg  Pulse 81  Temp(Src) 97.7 F (36.5 C) (Oral)  Resp 18  SpO2 98% Physical Exam  Constitutional: He appears well-developed and well-nourished.  HENT:  Head: Normocephalic and atraumatic.  Right Ear: External ear normal.  Left Ear: External ear normal.  Nose: Nose normal.  Eyes: Conjunctivae and EOM are normal.  Neck: Neck supple. No  tracheal deviation present.  Pulmonary/Chest: Effort normal. No stridor. No respiratory distress.  Abdominal: Soft. Bowel sounds are normal. He exhibits no distension. There is no tenderness. There is no rebound and no guarding.  Musculoskeletal: He exhibits no edema or tenderness.       Lumbar back: He exhibits decreased range of motion, pain and spasm. He exhibits no swelling and no edema.  Neurological: He is alert. He is not disoriented. No cranial nerve deficit or sensory deficit. He exhibits normal muscle tone. Coordination normal.  5 out of 5 strength plantar flexion bilaterally  Skin: Skin is warm and dry. No rash noted. He is not diaphoretic. No erythema.  Psychiatric: He has a normal mood and affect. His behavior is normal. Thought content normal.  Nursing note and vitals reviewed.   ED Course  Procedures (including critical care time) Labs Review Labs Reviewed - No data to display  Imaging Review Dg Lumbar Spine Complete  12/29/2014  CLINICAL DATA:  Low back pain for 3 months, row right greater than left side. Worsening over last few days. No known injury. EXAM: LUMBAR SPINE - COMPLETE 4+ VIEW COMPARISON:  06/06/2012 FINDINGS: Mild degenerate spurring throughout the lumbar spine. Disc spaces are maintained. Mild degenerative facet disease at L4-5 and L5-S1. Normal alignment. No fracture. SI joints are symmetric and unremarkable. IMPRESSION: Degenerative spurring and degenerative facet disease. No acute bony abnormality. Electronically Signed   By: Charlett Nose M.D.   On: 12/29/2014 14:04   I have personally reviewed and evaluated these images and lab results as part of my medical decision-making.    MDM   Final diagnoses:  Midline low back pain without sciatica    The patient has had trouble with her back years. The patient does not have a primary care doctor right now as he recently moved into this area. X-rays do show degenerative changes but no fracture or any lytic  lesions.  Patient is not having any fever or weakness. He does not have any abdominal tenderness.  Think the patient is stable to follow up with a primary care doctor. I will give him prescription for Ultram and Soma.   Linwood Dibbles, MD 12/29/14 [phone]

## 2014-12-29 NOTE — Progress Notes (Addendum)
Spoke with Morrie SheldonAshley at [place] office for Fleet Contrasdwin avbuere [phone] To obtain an appt for him for 02/01/15 at 1045 at [address] (closer to pt address on [address] per Morrie SheldonAshley)  Entered in D/c instructions  For non-medical rides in [place], [address] at [employer] are provided by volunteer drivers to only the[employer]: grocery stores, food banks, drug stores, banks/credit unions, money order vendors, [employer] bill payments, personal care, post [employer] [facility] Schedule 7-10 day in advance call [phone] provides rides for:  Persons who are 60+, ambulatory, alert and oriented, unable to drive themselves, have no ready access to other transportation, and live within the city limits of [place].  Engineer, [employer] Whe[employer] Call on 12/29/2014 Senior Wheels at [phone] for all [place] residents. 55 and over Limit one ride per week.  Rides must be scheduled at least one week in advance and must occur between 8:30 a.m. and 4:00 p. Fleet ContrasEdwin Avbuere, MD Go on 02/01/2015 Please arrive at this appt 15 minutes early and bring your ID and insurance cards with you [address] [place] [phone]

## 2014-12-29 NOTE — ED Notes (Addendum)
Per EMS, pt complains of lower back pain for the past 3 months. Pt [facility] back [facility], pt says his pain is not improving.

## 2014-12-29 NOTE — Progress Notes (Signed)
Pt noted with 3 ED visits in the last 6 months Cm noted pt without a pcp and with medicare and bcbs coverage Pt informs Cm [place] and has only support of a nephew and may need assist with a ride to a pcp after CM offered him a list of medicare providers within 25 [place] of [place] zip code [email or web address]  Pt agreed to allow CM to contact a few providers to set up an appt for follow up care in the morning preferrably around 0900

## 2014-12-29 NOTE — Discharge Instructions (Signed)

## 2016-04-29 IMAGING — CR DG FOOT COMPLETE 3+V*R*
3 series · 3 of 3 positions shown · non-contrast
Comparison: None.

CLINICAL DATA: Third toe pain for 1 month after stubbing injury.
Discolored foot. Initial encounter.

EXAM:
RIGHT FOOT COMPLETE - 3+ VIEW

[foot ap]
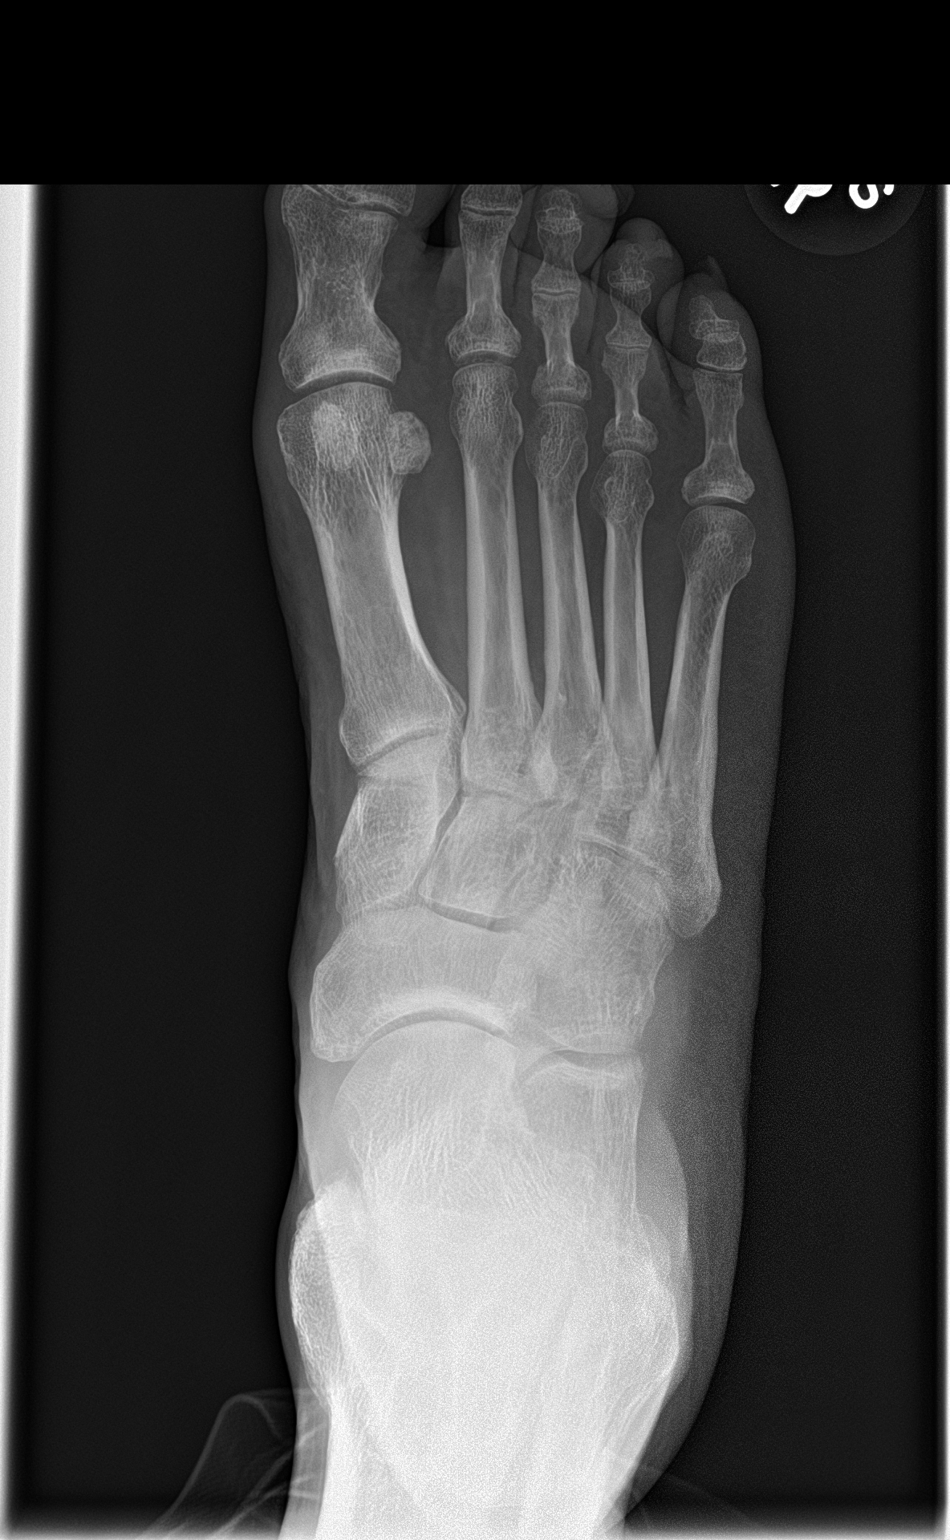

[foot obl]
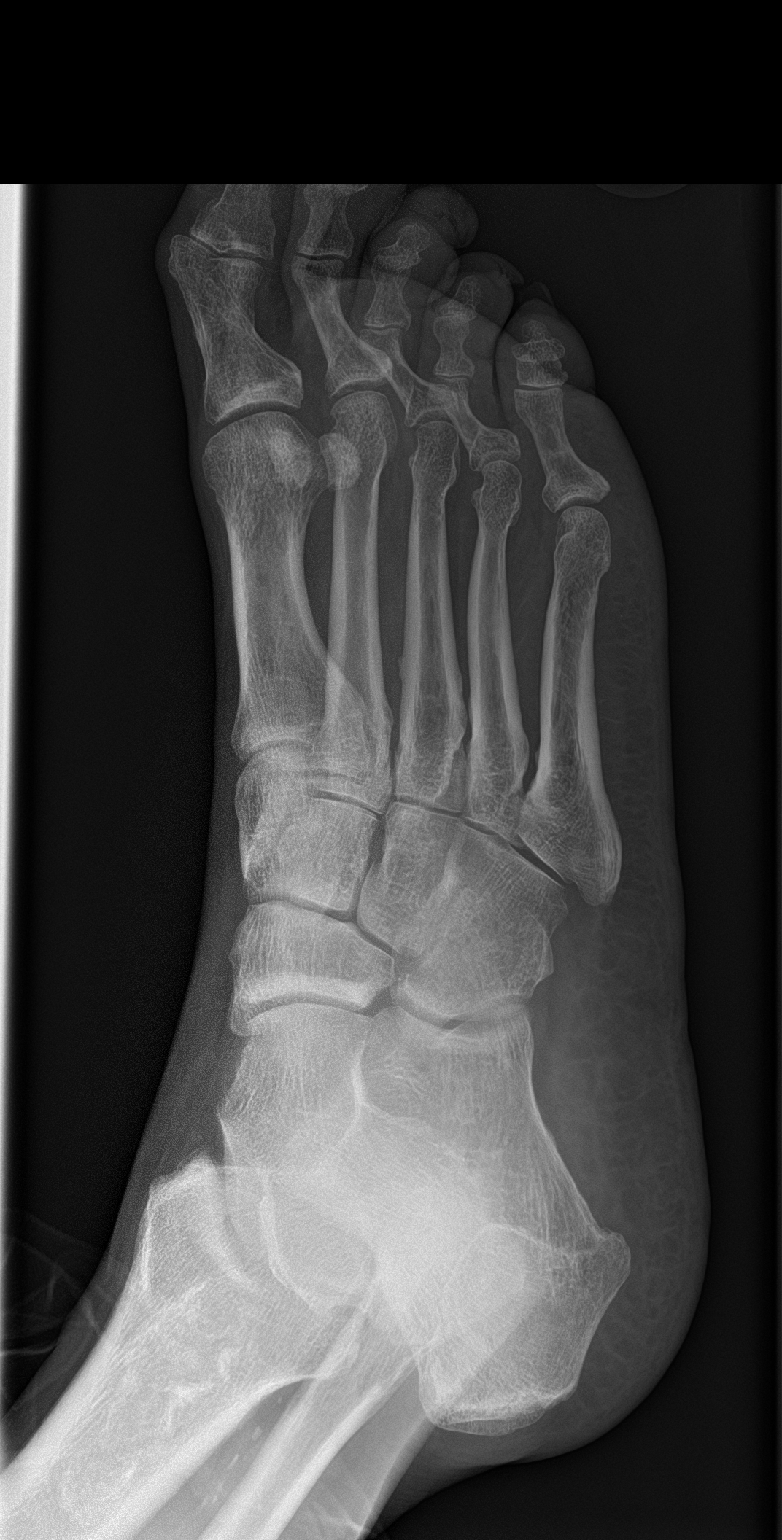

[foot lat]
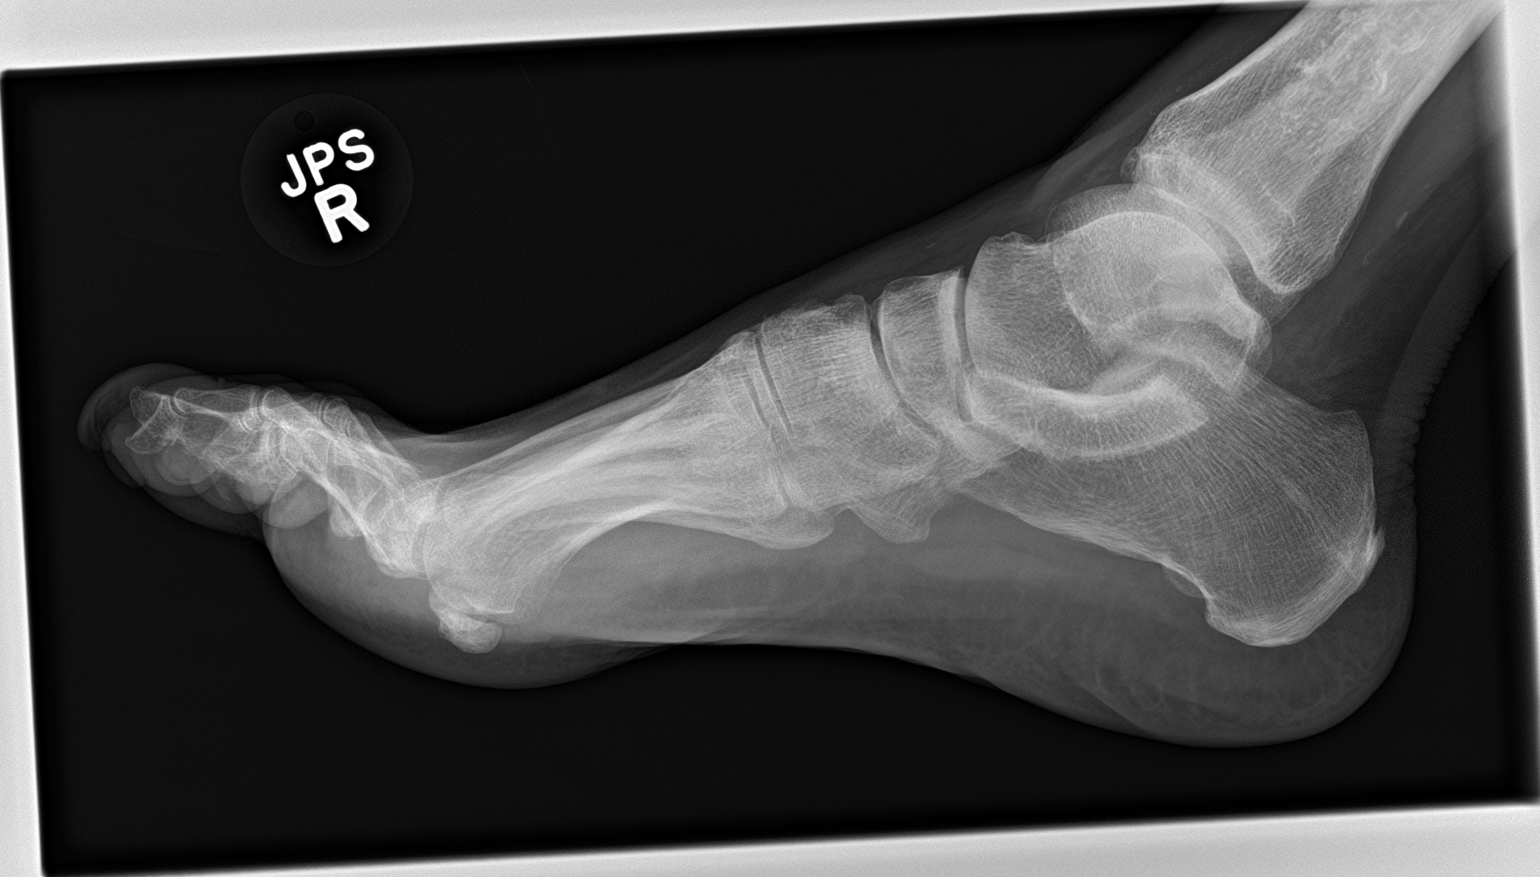

[3 of 3 positions shown; findings below may reference images not displayed]

FINDINGS: When allowing for digital obliquity, there is no convincing toe
fracture and no dislocation. No opaque foreign body or other acute
soft tissue finding.

In the distal tibia is a 45 mm long area of geographic/serpentine
peripheral sclerosis most consistent with bone infarct. Chondroid
tumor is the main differential consideration. The appearance is
overall benign.
IMPRESSION: 1. No acute osseous finding.
2. Distal tibia bone lesion favoring bone infarct over chondroid
lesion.

## 2023-04-09 DEATH — deceased
